# Patient Record
Sex: Male | Born: 1985 | Race: Black or African American | Hispanic: No | Marital: Single | Smoking: Current some day smoker
Health system: Southern US, Community
[De-identification: ages and names within clinical notes are randomized; demographics above are authoritative.]

## PROBLEM LIST (undated history)

## (undated) DIAGNOSIS — I10 Essential (primary) hypertension: Secondary | ICD-10-CM

---

## 2007-12-09 ENCOUNTER — Emergency Department (HOSPITAL_COMMUNITY): Admission: EM | Admit: 2007-12-09 | Discharge: 2007-12-09 | Payer: Self-pay | Admitting: Emergency Medicine

## 2009-04-21 IMAGING — CR DG CHEST 2V
2 series · 2 of 2 positions shown · non-contrast
Comparison: None.

CLINICAL DATA: Fever.  Left-sided chest pain.  Difficulty
breathing.

CHEST - 2 VIEW

[w chest pa]
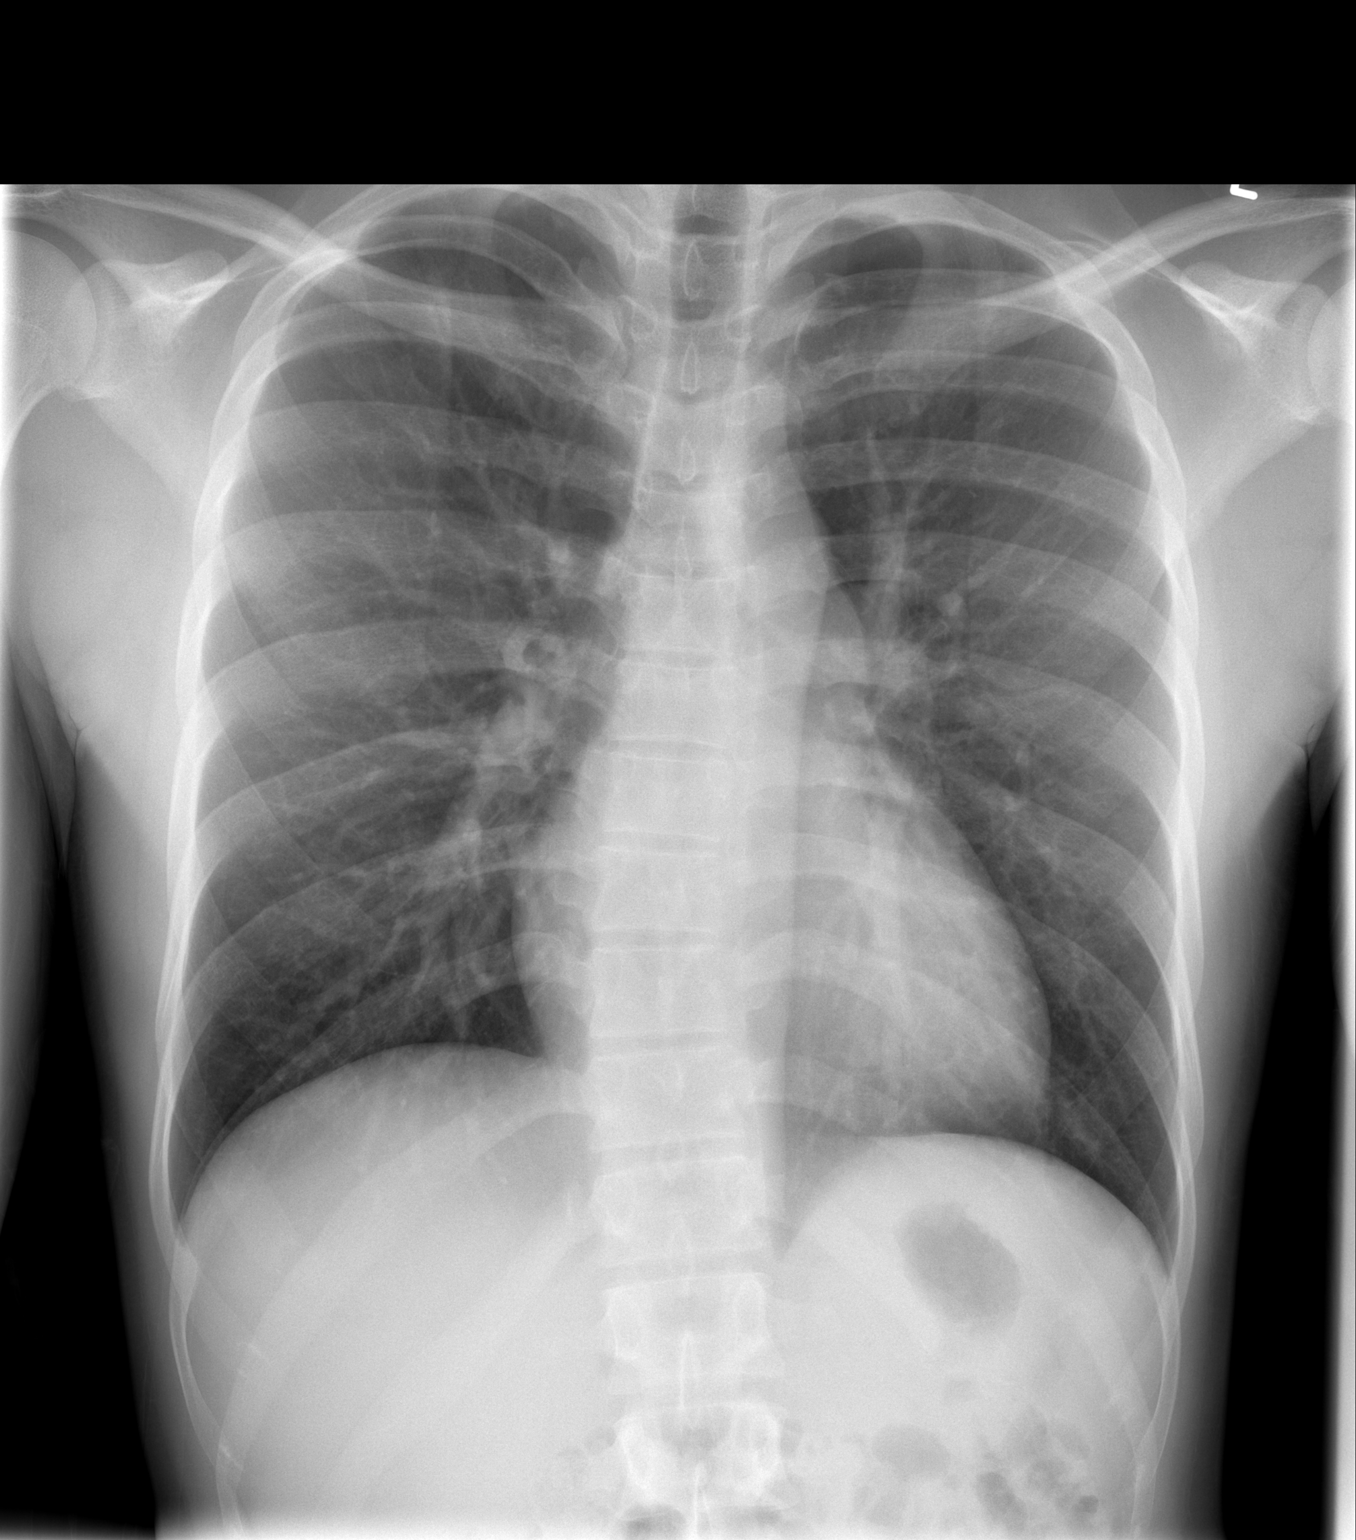

[w chest lat]
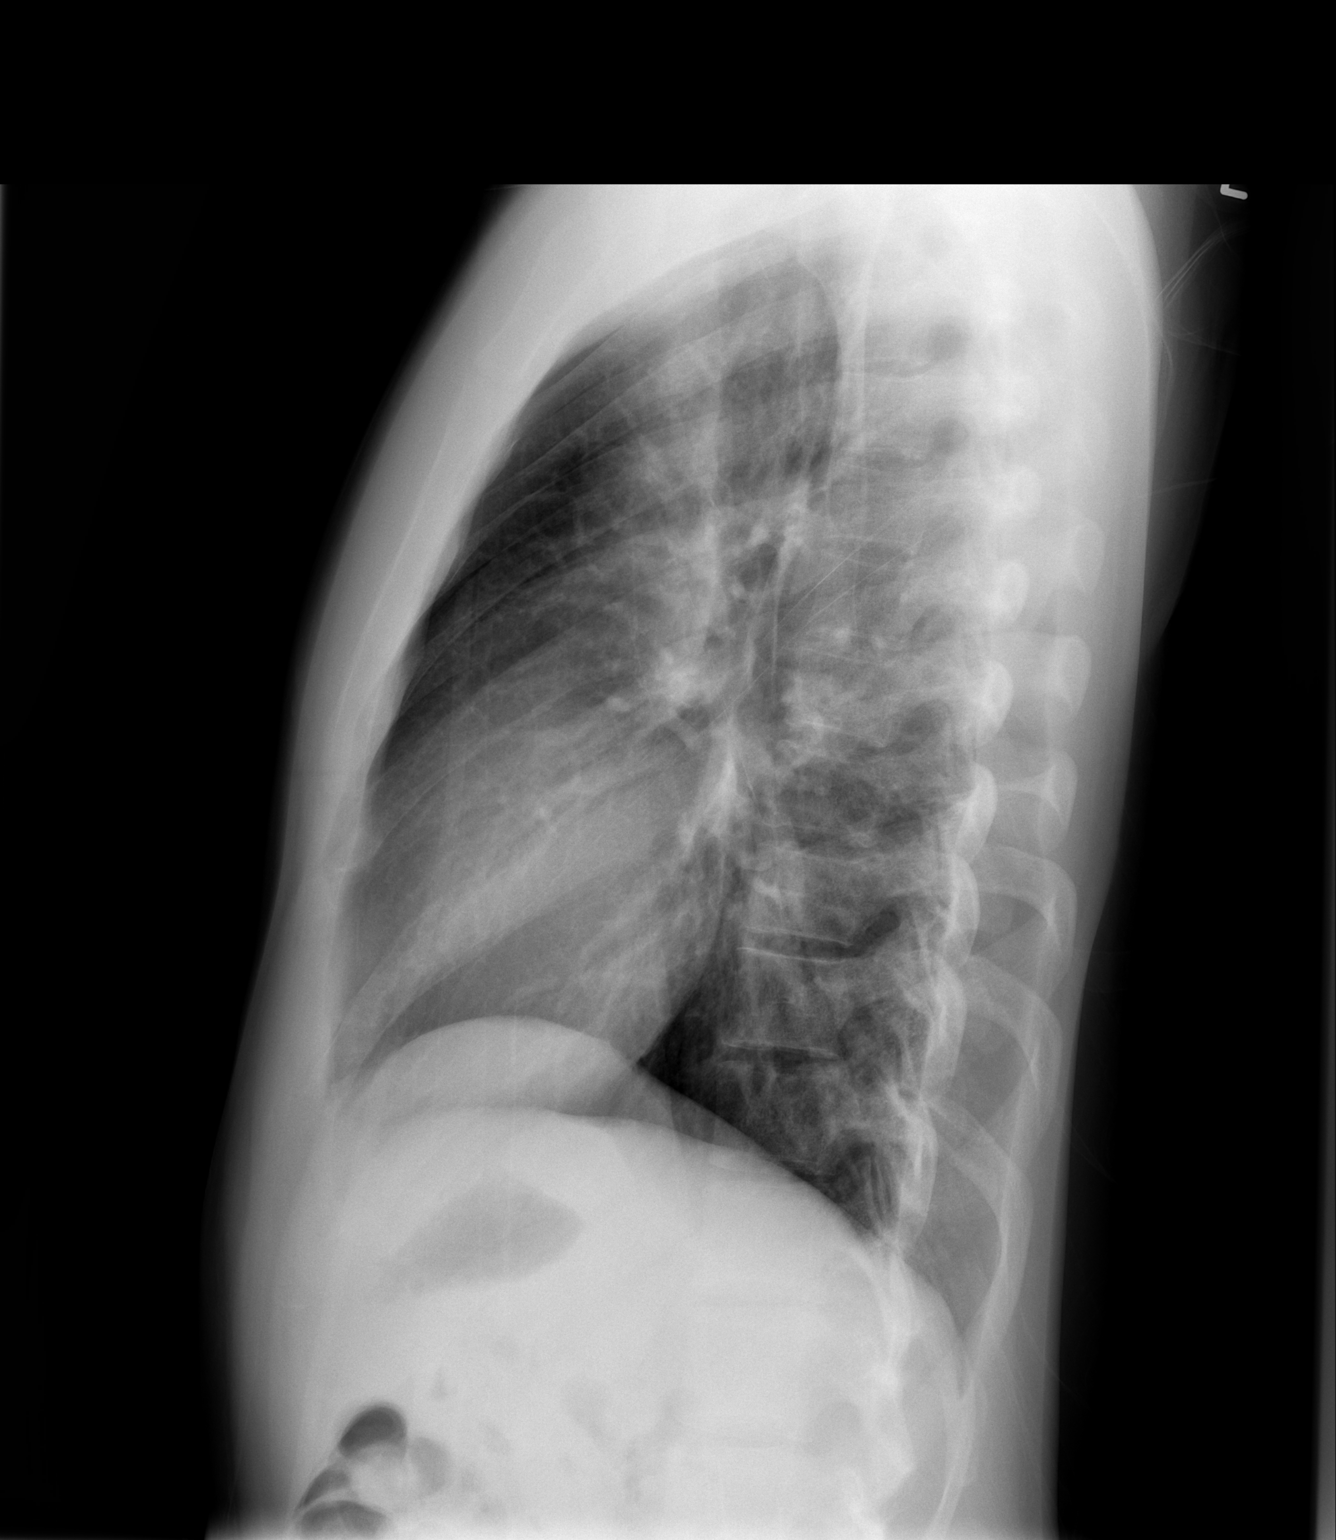

[2 of 2 positions shown; findings below may reference images not displayed]

FINDINGS: No pneumothorax.  No segmental consolidation.  Mild
gentle dextroscoliosis of the thoracic spine.  Heart size within
normal limits.  Mediastinal structures unremarkable.
IMPRESSION: No segmental consolidation or pneumothorax.]

## 2010-12-19 LAB — RAPID STREP SCREEN (MED CTR MEBANE ONLY): Streptococcus, Group A Screen (Direct): NEGATIVE

## 2018-04-23 ENCOUNTER — Encounter (HOSPITAL_COMMUNITY): Payer: Self-pay | Admitting: Emergency Medicine

## 2018-04-23 ENCOUNTER — Other Ambulatory Visit: Payer: Self-pay

## 2018-04-23 ENCOUNTER — Emergency Department (HOSPITAL_COMMUNITY)
Admission: EM | Admit: 2018-04-23 | Discharge: 2018-04-23 | Disposition: A | Discharge status: Home / Self Care | Payer: Self-pay | Attending: Emergency Medicine | Admitting: Emergency Medicine

## 2018-04-23 DIAGNOSIS — R55 Syncope and collapse: Secondary | ICD-10-CM | POA: Insufficient documentation

## 2018-04-23 LAB — COMPREHENSIVE METABOLIC PANEL
ALT: 29 U/L (ref 0–44)
ANION GAP: 9 (ref 5–15)
AST: 24 U/L (ref 15–41)
Albumin: 4.2 g/dL (ref 3.5–5.0)
Alkaline Phosphatase: 51 U/L (ref 38–126)
BUN: 12 mg/dL (ref 6–20)
CHLORIDE: 105 mmol/L (ref 98–111)
CO2: 24 mmol/L (ref 22–32)
Calcium: 9 mg/dL (ref 8.9–10.3)
Creatinine, Ser: 1.04 mg/dL (ref 0.61–1.24)
GFR calc non Af Amer: >60 mL/min (ref >60)
GLUCOSE: 84 mg/dL (ref 70–99)
Potassium: 3.7 mmol/L (ref 3.5–5.1)
SODIUM: 138 mmol/L (ref 135–145)
Total Bilirubin: 1.1 mg/dL (ref 0.3–1.2)
Total Protein: 6.9 g/dL (ref 6.5–8.1)

## 2018-04-23 LAB — CBC WITH DIFFERENTIAL/PLATELET
ABS IMMATURE GRANULOCYTES: 0.04 10*3/uL (ref 0.00–0.07)
Basophils Absolute: 0 10*3/uL (ref 0.0–0.1)
Basophils Relative: 0 %
Eosinophils Absolute: 0.1 10*3/uL (ref 0.0–0.5)
Eosinophils Relative: 0 %
HCT: 47.5 % (ref 39.0–52.0)
HEMOGLOBIN: 16.2 g/dL (ref 13.0–17.0)
Immature Granulocytes: 0 %
LYMPHS PCT: 17 %
Lymphs Abs: 2 10*3/uL (ref 0.7–4.0)
MCH: 29.7 pg (ref 26.0–34.0)
MCHC: 34.1 g/dL (ref 30.0–36.0)
MCV: 87.2 fL (ref 80.0–100.0)
MONO ABS: 1.1 10*3/uL — AB (ref 0.1–1.0)
Monocytes Relative: 9 %
NEUTROS ABS: 9 10*3/uL — AB (ref 1.7–7.7)
NRBC: 0 % (ref 0.0–0.2)
Neutrophils Relative %: 74 %
PLATELETS: 249 10*3/uL (ref 150–400)
RBC: 5.45 MIL/uL (ref 4.22–5.81)
RDW: 13.2 % (ref 11.5–15.5)
WBC: 12.2 10*3/uL — AB (ref 4.0–10.5)

## 2018-04-23 LAB — MAGNESIUM: Magnesium: 2.3 mg/dL (ref 1.7–2.4)

## 2018-04-23 MED ORDER — SODIUM CHLORIDE 0.9 % IV BOLUS
1000.0000 mL | Freq: Once | INTRAVENOUS | Status: AC
  Administered 2018-04-23: 1000 mL via INTRAVENOUS

## 2018-04-23 NOTE — ED Notes (Signed)
Pt ambulated independently in hall, pt denies any dizziness or weakness.

## 2018-04-23 NOTE — ED Triage Notes (Signed)
Pt arrived EMS. Pt was eating dinner when he started to feel light headed, hands cramping and dizzy. Pt finished most of his meal when he stood up and had a syncopal episode. Pt states he had brief LOC. When EMS arrived pt BP was 84/50. CBG:83. Pt was given NS and BP elevated to 128/84. Per EMS pt was diaphoretic on scene. When pt arrived to hospital pt began to feel better. Pt states he is starting to feel like his normal self. Pt still has some bilateral hand cramping.

## 2018-04-23 NOTE — ED Notes (Signed)
Patient verbalizes understanding of discharge instructions. Opportunity for questioning and answers were provided. Armband removed by staff, pt discharged from ED. Pt ambulatory to lobby.  

## 2018-04-23 NOTE — ED Provider Notes (Signed)
[facility] EMERGENCY DEPARTMENT Provider Note   CSN: [id number] Arrival date & time: 04/23/18  1939     History   Chief Complaint Chief Complaint  Patient presents with  . Loss of Consciousness    HPI Robert Stafford is a 33 y.o. male.  HPI Patient states he was in his normal state of health when he went to dinner this evening.  While sitting at the table he became lightheaded, diaphoretic.  Had ringing in both ears.  He then had a brief loss of consciousness and was added to the floor by a friend.  No head or neck trauma.  EMS was called and patient was noted to be hypotensive.  He began having spasms to both hands and abdominal cramping on transport to the emergency department.  Currently the symptoms have improved.  Now complains of generalized fatigue and mild headache.  Denies chest pain or shortness of breath.  No new lower extremity swelling or pain.  States he is had a similar episode of syncope when he was a child. History reviewed. No pertinent past medical history.  There are no active problems to display for this patient.   History reviewed. No pertinent surgical history.      Home Medications    Prior to Admission medications   Not on File    Family History No family history on file.  Social History Social History   Tobacco Use  . Smoking status: Not on file  Substance Use Topics  . Alcohol use: Not on file  . Drug use: Not on file     Allergies   Patient has no known allergies.   Review of Systems Review of Systems  Constitutional: Negative for chills and fever.  HENT: Negative for ear pain, sore throat and trouble swallowing.   Eyes: Negative for pain and visual disturbance.  Respiratory: Negative for cough and shortness of breath.   Cardiovascular: Negative for chest pain, palpitations and leg swelling.  Gastrointestinal: Positive for abdominal pain. Negative for diarrhea, nausea and vomiting.  Genitourinary: Negative for  dysuria and hematuria.  Musculoskeletal: Negative for arthralgias, back pain, myalgias, neck pain and neck stiffness.  Skin: Negative for color change, rash and wound.  Neurological: Positive for syncope and light-headedness. Negative for dizziness, seizures, weakness, numbness and headaches.  All other systems reviewed and are negative.    Physical Exam Updated Vital Signs BP 128/85   Pulse 86   Temp 98.2 F (36.8 C) (Oral)   Resp (!) 22   SpO2 100%   Physical Exam Vitals signs and nursing note reviewed.  Constitutional:      Appearance: Normal appearance. He is well-developed.  HENT:     Head: Normocephalic and atraumatic.     Nose: No congestion or rhinorrhea.     Mouth/Throat:     Mouth: Mucous membranes are moist.     Pharynx: No oropharyngeal exudate.  Eyes:     Extraocular Movements: Extraocular movements intact.     Pupils: Pupils are equal, round, and reactive to light.  Neck:     Musculoskeletal: Normal range of motion and neck supple. No neck rigidity or muscular tenderness.     Comments: No posterior midline cervical tenderness to palpation. Cardiovascular:     Rate and Rhythm: Normal rate and regular rhythm.  Pulmonary:     Effort: Pulmonary effort is normal. No respiratory distress.     Breath sounds: Normal breath sounds. No stridor. No wheezing, rhonchi or rales.  Abdominal:  General: Bowel sounds are normal.     Palpations: Abdomen is soft.     Tenderness: There is no abdominal tenderness. There is no right CVA tenderness, left CVA tenderness, guarding or rebound.  Musculoskeletal: Normal range of motion.        General: No swelling, tenderness, deformity or signs of injury.     Right lower leg: No edema.     Left lower leg: No edema.  Lymphadenopathy:     Cervical: No cervical adenopathy.  Skin:    General: Skin is warm and dry.     Capillary Refill: Capillary refill takes less than 2 seconds.     Findings: No erythema or rash.  Neurological:       General: No focal deficit present.     Mental Status: He is alert and oriented to person, place, and time.     Comments: 5/5 motor in all extremities.  Sensation fully intact.  Psychiatric:        Mood and Affect: Mood normal.        Behavior: Behavior normal.      ED Treatments / Results  Labs (all labs ordered are listed, but only abnormal results are displayed) Labs Reviewed  CBC WITH DIFFERENTIAL/PLATELET - Abnormal; Notable for the following components:      Result Value   WBC 12.2 (*)    Neutro Abs 9.0 (*)    Monocytes Absolute 1.1 (*)    All other components within normal limits  COMPREHENSIVE METABOLIC PANEL  MAGNESIUM    EKG EKG Interpretation  Date/Time:  Tuesday April 23 2018 20:37:00 EST Ventricular Rate:  96 PR Interval:    QRS Duration: 103 QT Interval:  348 QTC Calculation: 440 R Axis:   76 Text Interpretation:  Sinus rhythm Borderline short PR interval Probable left ventricular hypertrophy Anterior ST elevation, probably due to LVH Baseline wander in lead(s) V1 Confirmed by Loren Racer ([id number]) on 04/23/2018 8:44:25 PM   Radiology No results found.  Procedures Procedures (including critical care time)  Medications Ordered in ED Medications  sodium chloride 0.9 % bolus 1,000 mL (0 mLs Intravenous Stopped 04/23/18 2142)     Initial Impression / Assessment and Plan / ED Course  I have reviewed the triage vital signs and the nursing notes.  Pertinent labs & imaging results that were available during my care of the patient were reviewed by me and considered in my medical decision making (see chart for details).     Patient observed in the emergency department.  Maintained stable vital signs.  Normal neurologic exam.  States he is feeling much better after IV fluids.  Likely vasovagal episode.  Advised follow-up with cardiology if symptoms persist and strict return precautions given.  Final Clinical Impressions(s) / ED Diagnoses   Final  diagnoses:  Vasovagal syncope    ED Discharge Orders    None       Loren Racer, MD 04/23/18 2216

## 2019-06-05 ENCOUNTER — Ambulatory Visit: Payer: Self-pay | Attending: Internal Medicine

## 2019-06-05 DIAGNOSIS — Z23 Encounter for immunization: Secondary | ICD-10-CM

## 2019-06-05 NOTE — Progress Notes (Signed)
   Covid-19 Vaccination Clinic  Name:  Robert Stafford    MRN: [id number] DOB: 13-Aug-1985  06/05/2019  [name] was observed post Covid-19 immunization for 15 minutes without incident. He was provided with Vaccine Information Sheet and instruction to access the V-Safe system.   [name] was instructed to call 911 with any severe reactions post vaccine: Marland Kitchen Difficulty breathing  . Swelling of face and throat  . A fast heartbeat  . A bad rash all over body  . Dizziness and weakness   Immunizations Administered    Name Date Dose VIS Date Route   Moderna COVID-19 Vaccine 06/05/2019 10:37 AM 0.5 mL 02/18/2019 Intramuscular   Manufacturer: Moderna   Lot: [id number]   NDC: 48270-786-75

## 2019-07-08 ENCOUNTER — Ambulatory Visit: Payer: Self-pay | Attending: Family

## 2019-07-08 DIAGNOSIS — Z23 Encounter for immunization: Secondary | ICD-10-CM

## 2019-07-08 NOTE — Progress Notes (Signed)
   Covid-19 Vaccination Clinic  Name:  Robert Stafford    MRN: [id number] DOB: 05/29/1985  07/08/2019  [name] was observed post Covid-19 immunization for 15 minutes without incident. He was provided with Vaccine Information Sheet and instruction to access the V-Safe system.   [name] was instructed to call 911 with any severe reactions post vaccine: Marland Kitchen Difficulty breathing  . Swelling of face and throat  . A fast heartbeat  . A bad rash all over body  . Dizziness and weakness   Immunizations Administered    Name Date Dose VIS Date Route   Moderna COVID-19 Vaccine 07/08/2019 10:32 AM 0.5 mL 02/2019 Intramuscular   Manufacturer: Moderna   Lot: [id number]   NDC: 98264-158-30

## 2021-03-22 ENCOUNTER — Other Ambulatory Visit: Payer: Self-pay | Admitting: Podiatry

## 2021-03-22 ENCOUNTER — Ambulatory Visit (INDEPENDENT_AMBULATORY_CARE_PROVIDER_SITE_OTHER): Payer: 59 | Admitting: Podiatry

## 2021-03-22 DIAGNOSIS — M79672 Pain in left foot: Secondary | ICD-10-CM

## 2021-03-23 NOTE — Progress Notes (Signed)
Error

## 2021-03-28 ENCOUNTER — Ambulatory Visit: Payer: 59 | Admitting: Podiatry

## 2021-03-30 ENCOUNTER — Ambulatory Visit (INDEPENDENT_AMBULATORY_CARE_PROVIDER_SITE_OTHER): Payer: 59 | Admitting: Podiatry

## 2021-03-30 ENCOUNTER — Ambulatory Visit (INDEPENDENT_AMBULATORY_CARE_PROVIDER_SITE_OTHER): Payer: 59

## 2021-03-30 ENCOUNTER — Encounter: Payer: Self-pay | Admitting: Podiatry

## 2021-03-30 ENCOUNTER — Other Ambulatory Visit: Payer: Self-pay

## 2021-03-30 DIAGNOSIS — M21622 Bunionette of left foot: Secondary | ICD-10-CM

## 2021-03-30 DIAGNOSIS — F17200 Nicotine dependence, unspecified, uncomplicated: Secondary | ICD-10-CM | POA: Insufficient documentation

## 2021-03-30 DIAGNOSIS — M79672 Pain in left foot: Secondary | ICD-10-CM

## 2021-03-30 NOTE — Progress Notes (Signed)
°  Subjective:  Patient ID: Robert Stafford, male    DOB: 1985/09/04,   MRN: [id number]  Chief Complaint  Patient presents with   Pain    Lt 5th toe joint pain x couple mo - no injury - 5/10 sharp pains - worse w/ prolong standing tx: resting    36 y.o. male presents for concern of pain to the left fifth to joint. This has been going on a couple months. Relates pain after a long day of work. Denies any treatment . Denies any other pedal complaints. Denies n/v/f/c.   History reviewed. No pertinent past medical history.  Objective:  Physical Exam: Vascular: DP/PT pulses 2/4 bilateral. CFT <3 seconds. Normal hair growth on digits. No edema.  Skin. No lacerations or abrasions bilateral feet.  Musculoskeletal: MMT 5/5 bilateral lower extremities in DF, PF, Inversion and Eversion. Deceased ROM in DF of ankle joint. Tailors bunion noted with mild tenderness to palpation no pain with ROM.  Neurological: Sensation intact to light touch.   Assessment:   1. Tailor's bunion of left foot      Plan:  Patient was evaluated and treated and all questions answered. -Xrays reviewed. Minor tailors bunion noted with flare of the fifth metatarsal and prominent metatarsal head.  -Discussed tailors bunion and treatment options;conservative and surgical management; risks, benefits, alternatives discussed. All patient's questions answered. -Prescription for meloxicam provided.  -Discussed padding and wide shoe gear.   -Recommend continue with good supportive shoes and inserts.  -Discussed surgical options.  -Patient to return to office as needed or sooner if condition worsens.    Louann Sjogren, DPM

## 2021-04-04 ENCOUNTER — Other Ambulatory Visit: Payer: Self-pay | Admitting: Podiatry

## 2021-04-04 DIAGNOSIS — M21622 Bunionette of left foot: Secondary | ICD-10-CM

## 2023-02-11 ENCOUNTER — Encounter (HOSPITAL_BASED_OUTPATIENT_CLINIC_OR_DEPARTMENT_OTHER): Payer: Self-pay | Admitting: Emergency Medicine

## 2023-02-11 ENCOUNTER — Other Ambulatory Visit: Payer: Self-pay

## 2023-02-11 ENCOUNTER — Emergency Department (HOSPITAL_COMMUNITY): Payer: BC Managed Care – PPO

## 2023-02-11 ENCOUNTER — Emergency Department (HOSPITAL_BASED_OUTPATIENT_CLINIC_OR_DEPARTMENT_OTHER)
Admission: EM | Admit: 2023-02-11 | Discharge: 2023-02-11 | Disposition: A | Discharge status: Home / Self Care | Payer: BC Managed Care – PPO | Attending: Emergency Medicine | Admitting: Emergency Medicine

## 2023-02-11 DIAGNOSIS — N4889 Other specified disorders of penis: Secondary | ICD-10-CM | POA: Insufficient documentation

## 2023-02-11 DIAGNOSIS — S3021XA Contusion of penis, initial encounter: Secondary | ICD-10-CM

## 2023-02-11 HISTORY — DX: Essential (primary) hypertension: I10

## 2023-02-11 LAB — BASIC METABOLIC PANEL
Anion gap: 8 (ref 5–15)
BUN: 12 mg/dL (ref 6–20)
CO2: 25 mmol/L (ref 22–32)
Calcium: 9.1 mg/dL (ref 8.9–10.3)
Chloride: 102 mmol/L (ref 98–111)
Creatinine, Ser: 0.96 mg/dL (ref 0.61–1.24)
GFR, Estimated: >60 mL/min (ref >60)
Glucose, Bld: 99 mg/dL (ref 70–99)
Potassium: 4 mmol/L (ref 3.5–5.1)
Sodium: 135 mmol/L (ref 135–145)

## 2023-02-11 LAB — CBC
HCT: 47.5 % (ref 39.0–52.0)
Hemoglobin: 16.1 g/dL (ref 13.0–17.0)
MCH: 29.4 pg (ref 26.0–34.0)
MCHC: 33.9 g/dL (ref 30.0–36.0)
MCV: 86.8 fL (ref 80.0–100.0)
Platelets: 246 10*3/uL (ref 150–400)
RBC: 5.47 MIL/uL (ref 4.22–5.81)
RDW: 13.3 % (ref 11.5–15.5)
WBC: 11.4 10*3/uL — ABNORMAL HIGH (ref 4.0–10.5)
nRBC: 0 % (ref 0.0–0.2)

## 2023-02-11 MED ORDER — HYDROCODONE-ACETAMINOPHEN 5-325 MG PO TABS
1.0000 | ORAL_TABLET | Freq: Once | ORAL | Status: AC
  Administered 2023-02-11: 1 via ORAL
  Filled 2023-02-11: qty 1

## 2023-02-11 MED ORDER — IBUPROFEN 400 MG PO TABS
600.0000 mg | ORAL_TABLET | Freq: Once | ORAL | Status: AC
  Administered 2023-02-11: 600 mg via ORAL
  Filled 2023-02-11: qty 1

## 2023-02-11 MED ORDER — GADOBUTROL 1 MMOL/ML IV SOLN
8.0000 mL | Freq: Once | INTRAVENOUS | Status: AC | PRN
  Administered 2023-02-11: 8 mL via INTRAVENOUS

## 2023-02-11 NOTE — ED Provider Notes (Signed)
[name] Provider Note   CSN: [id number] Arrival date & time: 02/11/23  1405     History  Chief Complaint  Patient presents with   Groin Swelling    Robert Stafford is a 37 y.o. male with overall noncontributory past medical history who presents concern for penile swelling, pain after sexual intercourse last night.  Was seen in urgent care and sent here for imaging.  Concern for possible penile fracture.  He reports that he was having intercourse last night and had sudden swelling of the penis, but denies severe pain, reports that it is only somewhat sore.  He reports that the swelling appeared more dramatic while penis was erect, now it is quite swollen but soft. Denies immediate loss of erection.   HPI     Home Medications Prior to Admission medications   Not on File      Allergies    Patient has no known allergies.    Review of Systems   Review of Systems  All other systems reviewed and are negative.   Physical Exam Updated Vital Signs BP (!) 134/92   Pulse 89   Temp 98.3 F (36.8 C)   Resp 18   Wt 81.2 kg   SpO2 98%  Physical Exam Vitals and nursing note reviewed.  Constitutional:      General: He is not in acute distress.    Appearance: Normal appearance.  HENT:     Head: Normocephalic and atraumatic.  Eyes:     General:        Right eye: No discharge.        Left eye: No discharge.  Cardiovascular:     Rate and Rhythm: Normal rate and regular rhythm.  Pulmonary:     Effort: Pulmonary effort is normal. No respiratory distress.  Genitourinary:    Comments: Significant soft tissue swelling, with dependent swelling on the inferior aspect of the penis, the skin around the penis is quite tight, there is not a dramatic amount of purpleish bruising, or distention.  There is no damage to the glans.  There is no testicle swelling. Musculoskeletal:        General: No deformity.  Skin:    General: Skin is warm and  dry.  Neurological:     Mental Status: He is alert and oriented to person, place, and time.  Psychiatric:        Mood and Affect: Mood normal.        Behavior: Behavior normal.     ED Results / Procedures / Treatments   Labs (all labs ordered are listed, but only abnormal results are displayed) Labs Reviewed  CBC  BASIC METABOLIC PANEL    EKG None  Radiology No results found.  Procedures Procedures    Medications Ordered in ED Medications - No data to display  ED Course/ Medical Decision Making/ A&P Clinical Course as of 02/11/23 1520  Sun Feb 11, 2023  1503 [place] to [facility] for MRI pelvis [CP]    Clinical Course User Index [CP] Olene Floss, PA-C                                 Medical Decision Making  With overall noncontributory past medical history who presents with concern for penile swelling after sexual intercourse last night.  Patient reports that he did not have sudden loss of his erection, but there was some  swelling while the penis was still erect.  This morning he is having some dependent edema of the penis with some moderate pain.  On exam he has some significant soft tissue swelling with dependent swelling on the inferior aspect of the penis, there is some skin tightness around the middle of the penile shaft but no dramatic bruising or discoloration, the glans is uninterrupted.  There is no testicle swelling.  I spoke with Dr. Mena Goes with urology who after reviewing the media in the patient's chart feels that a partial tunica laceration versus damage to penile vein is more likely than a total penile fracture, however thinks that an MR pelvis would be reasonable to further evaluate.  Given degree of swelling, pain I do think that MR pelvis is warranted and discussed with patient who agrees.  We will transfer to [facility] for MRI of the pelvis, Dr. Theresia Lo is the accepting physician.  Patient will go POV. Final Clinical Impression(s) / ED  Diagnoses Final diagnoses:  Penile swelling    Rx / DC Orders ED Discharge Orders     None         [place] 02/11/23 1520    Alvira Monday, MD 02/11/23 2214

## 2023-02-11 NOTE — ED Triage Notes (Addendum)
Penile swelling and groin post sexual intercourse last night , was see at [facility] , sent here for imaging . Possible penile fracture  Denies testicular swelling .  Here also to rule out hernia

## 2023-02-11 NOTE — ED Provider Notes (Signed)
4:00 PM Patient transferred from [facility]. Awaiting MRI, then will consult urology. He's feeling a little more pain than before, will give some ibuprofen.  10:44 PM MRI is negative.  I discussed with Dr. Lafonda Mosses, who agrees with radiology.  He has recommended NSAIDs, ice packs, penile rest for a couple weeks.  Commands follow-up in the urology office in about 2 weeks.  Discussed all this with patient who is ready for discharge.   Pricilla Loveless, MD 02/11/23 [phone]

## 2023-02-11 NOTE — ED Notes (Signed)
Pt is going to [facility] POV for MRI and Uro consult. Dr Theresia Lo is accepting MD at [facility]

## 2023-02-11 NOTE — ED Notes (Signed)
Tried multiple times to call charge RN at [facility] to give report on this Pt. Phone went to voicemail each time.

## 2023-02-11 NOTE — Discharge Instructions (Addendum)
Apply ice to the bruised and swollen area and use ibuprofen 3 times per day.  Rest your penis, avoid sexual activity until you are healed.  Follow-up with the urologist, make an appointment for about 2 weeks from now.  If you develop new or worsening pain or swelling, blood in the urine, inability to urinate, or any other new/concerning symptoms and return to the ER or call 911.
